# Patient Record
Sex: Female | Born: 2000
Health system: Southern US, Community
[De-identification: ages and names within clinical notes are randomized; demographics above are authoritative.]

---
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
## 2023-12-20 ENCOUNTER — Ambulatory Visit: Payer: Self-pay | Admitting: Family

## 2023-12-20 ENCOUNTER — Other Ambulatory Visit (HOSPITAL_COMMUNITY)
Admission: RE | Admit: 2023-12-20 | Discharge: 2023-12-20 | Disposition: A | Discharge status: Home / Self Care | Source: Ambulatory Visit | Attending: Family | Admitting: Family

## 2023-12-20 ENCOUNTER — Encounter: Payer: Self-pay | Admitting: Family

## 2023-12-20 ENCOUNTER — Ambulatory Visit (INDEPENDENT_AMBULATORY_CARE_PROVIDER_SITE_OTHER): Admitting: Family

## 2023-12-20 VITALS — BP 110/62 | HR 101 | Temp 98.7°F | Resp 16 | Ht 63.5 in | Wt 222.6 lb

## 2023-12-20 DIAGNOSIS — E781 Pure hyperglyceridemia: Secondary | ICD-10-CM | POA: Diagnosis not present

## 2023-12-20 DIAGNOSIS — Z01419 Encounter for gynecological examination (general) (routine) without abnormal findings: Secondary | ICD-10-CM | POA: Insufficient documentation

## 2023-12-20 DIAGNOSIS — M545 Low back pain, unspecified: Secondary | ICD-10-CM | POA: Diagnosis not present

## 2023-12-20 DIAGNOSIS — R635 Abnormal weight gain: Secondary | ICD-10-CM | POA: Diagnosis not present

## 2023-12-20 DIAGNOSIS — Z0000 Encounter for general adult medical examination without abnormal findings: Secondary | ICD-10-CM

## 2023-12-20 DIAGNOSIS — Z720 Tobacco use: Secondary | ICD-10-CM | POA: Insufficient documentation

## 2023-12-20 DIAGNOSIS — Z0001 Encounter for general adult medical examination with abnormal findings: Secondary | ICD-10-CM | POA: Diagnosis not present

## 2023-12-20 DIAGNOSIS — Z23 Encounter for immunization: Secondary | ICD-10-CM

## 2023-12-20 DIAGNOSIS — Z832 Family history of diseases of the blood and blood-forming organs and certain disorders involving the immune mechanism: Secondary | ICD-10-CM | POA: Diagnosis not present

## 2023-12-20 DIAGNOSIS — R0789 Other chest pain: Secondary | ICD-10-CM | POA: Diagnosis not present

## 2023-12-20 LAB — COMPREHENSIVE METABOLIC PANEL WITH GFR
ALT: 28 U/L (ref 0–35)
AST: 23 U/L (ref 0–37)
Albumin: 4.2 g/dL (ref 3.5–5.2)
Alkaline Phosphatase: 97 U/L (ref 39–117)
BUN: 10 mg/dL (ref 6–23)
CO2: 27 meq/L (ref 19–32)
Calcium: 9 mg/dL (ref 8.4–10.5)
Chloride: 102 meq/L (ref 96–112)
Creatinine, Ser: 0.77 mg/dL (ref 0.40–1.20)
GFR: 110.36 mL/min (ref >60.00)
Glucose, Bld: 88 mg/dL (ref 70–99)
Potassium: 4.3 meq/L (ref 3.5–5.1)
Sodium: 138 meq/L (ref 135–145)
Total Bilirubin: 0.5 mg/dL (ref 0.2–1.2)
Total Protein: 6.8 g/dL (ref 6.0–8.3)

## 2023-12-20 LAB — CBC WITH DIFFERENTIAL/PLATELET
Basophils Absolute: 0 K/uL (ref 0.0–0.1)
Basophils Relative: 0.8 % (ref 0.0–3.0)
Eosinophils Absolute: 0.1 K/uL (ref 0.0–0.7)
Eosinophils Relative: 2.3 % (ref 0.0–5.0)
HCT: 40.5 % (ref 36.0–46.0)
Hemoglobin: 13.4 g/dL (ref 12.0–15.0)
Lymphocytes Relative: 29.5 % (ref 12.0–46.0)
Lymphs Abs: 1.3 K/uL (ref 0.7–4.0)
MCHC: 33.2 g/dL (ref 30.0–36.0)
MCV: 83.6 fl (ref 78.0–100.0)
Monocytes Absolute: 0.3 K/uL (ref 0.1–1.0)
Monocytes Relative: 6.8 % (ref 3.0–12.0)
Neutro Abs: 2.7 K/uL (ref 1.4–7.7)
Neutrophils Relative %: 60.6 % (ref 43.0–77.0)
Platelets: 314 K/uL (ref 150.0–400.0)
RBC: 4.84 Mil/uL (ref 3.87–5.11)
RDW: 13.6 % (ref 11.5–15.5)
WBC: 4.4 K/uL (ref 4.0–10.5)

## 2023-12-20 LAB — LIPID PANEL
Cholesterol: 208 mg/dL — ABNORMAL HIGH (ref 0–200)
HDL: 50.5 mg/dL (ref >39.00)
LDL Cholesterol: 125 mg/dL — ABNORMAL HIGH (ref 0–99)
NonHDL: 157.49
Total CHOL/HDL Ratio: 4
Triglycerides: 161 mg/dL — ABNORMAL HIGH (ref 0.0–149.0)
VLDL: 32.2 mg/dL (ref 0.0–40.0)

## 2023-12-20 LAB — TSH: TSH: 1.04 u[IU]/mL (ref 0.35–5.50)

## 2023-12-20 LAB — HEMOGLOBIN A1C: Hgb A1c MFr Bld: 5.7 % (ref 4.6–6.5)

## 2023-12-20 NOTE — Progress Notes (Signed)
 Subjective:     Patient ID: Regina Sherman, female    DOB: [birth year], 23 y.o.   MRN: [id number]  Chief Complaint  Patient presents with   Annual Exam    HPI  Discussed the use of AI scribe software for clinical note transcription with the patient, who gave verbal consent to proceed.  History of Present Illness  Regina Sherman is a 23 year old female who presents for an annual physical exam and Pap smear. She is accompanied by her mother.  She feels nervous about the Pap smear procedure. Her menstrual cycles are irregular, with periods sometimes absent for two months and then lasting only a day or extending for a week. She experiences frequent diarrhea and stomach pain, often needing to use the bathroom shortly after eating. She occasionally experiences chest pain at rest or with activity, sometimes with shortness of breath. She engages in regular physical activity, exercising at least five days a week, and is working on maintaining a healthy diet. She is not currently on any birth control due to side effects such as headaches, nausea, and weight gain. She uses nicotine and is trying to reduce her usage. She consumes alcohol occasionally and denies any drug use. No current cough, cold symptoms, skin concerns, hearing issues, or urinary problems. No recent depression or anxiety, though she feels stressed about her future plans and job search.     Health Maintenance Due  Topic Date Due   HIV Screening  Never done   Hepatitis C Screening  Never done   COVID-19 Vaccine (3 - 2024-25 season) 12/26/2022   Cervical Cancer Screening (Pap smear)  Never done   DTaP/Tdap/Td (6 - Td or Tdap) 12/08/2023   INFLUENZA VACCINE  11/25/2023    History reviewed. No pertinent past medical history.  Past Surgical History:  Procedure Laterality Date   ANTERIOR CRUCIATE LIGAMENT REPAIR Left 2020   ACL reconstruction   KNEE ARTHROSCOPY Left 2021    Family History  Problem Relation Age of Onset   Heart  murmur Father    Lung cancer Maternal Grandmother    Stroke Maternal Grandfather    Heart murmur Maternal Grandfather    Heart disease Paternal Grandmother    Heart disease Paternal Grandfather    Heart murmur Maternal Aunt    Cancer Neg Hx     Social History   Socioeconomic History   Marital status: Single    Spouse name: Not on file   Number of children: Not on file   Years of education: Not on file   Highest education level: Not on file  Occupational History   Not on file  Tobacco Use   Smoking status: Never    Passive exposure: Yes   Smokeless tobacco: Never  Substance and Sexual Activity   Alcohol use: Yes    Comment: occasional   Drug use: Never   Sexual activity: Not Currently  Other Topics Concern   Not on file  Social History Narrative   Not on file   Social Drivers of Health   Financial Resource Strain: Not on file  Food Insecurity: Not on file  Transportation Needs: Not on file  Physical Activity: Not on file  Stress: Not on file  Social Connections: Unknown (09/04/2021)   Received from [facility]   Social Network    Social Network: Not on file  Intimate Partner Violence: Unknown (07/27/2021)   Received from [facility]   HITS    Physically Hurt: Not on file  Insult or Talk Down To: Not on file    Threaten Physical Harm: Not on file    Scream or Curse: Not on file    Outpatient Medications Prior to Visit  Medication Sig Dispense Refill   cetirizine (ZYRTEC) 10 MG tablet Take 10 mg by mouth daily.     fluticasone (FLONASE) 50 MCG/ACT nasal spray Place into both nostrils daily.     No facility-administered medications prior to visit.    No Known Allergies  Review of Systems  Constitutional:  Negative for weight loss.  HENT:  Negative for congestion and hearing loss.   Eyes:  Negative for blurred vision.  Respiratory:  Negative for cough.   Cardiovascular:  Negative for leg swelling.  Gastrointestinal:  Positive for diarrhea. Negative  for constipation.  Genitourinary:  Negative for dysuria and frequency.  Musculoskeletal:  Negative for myalgias.  Skin:  Negative for rash.  Neurological:  Negative for headaches.  Psychiatric/Behavioral:         Denies depression/anxiety       Objective:    Physical Exam Exam conducted with a chaperone present.      BP 110/62 (BP Location: Right Arm, Patient Position: Sitting, Cuff Size: Large)   Pulse (!) 101   Temp 98.7 F (37.1 C) (Oral)   Resp 16   Ht 5' 3.5 (1.613 m)   Wt 222 lb 9.6 oz (101 kg)   SpO2 98%   BMI 38.81 kg/m  Wt Readings from Last 3 Encounters:  12/20/23 222 lb 9.6 oz (101 kg)  12/08/20 209 lb (94.8 kg) (98%, Z= 2.08)*  01/18/20 197 lb (89.4 kg) (98%, Z= 1.96)*   * Growth percentiles are based on CDC (Girls, 2-20 Years) data.   Physical Exam  Constitutional: She is oriented to person, place, and time. She appears well-developed and well-nourished. No distress.  HENT:  Head: Normocephalic and atraumatic.  Right Ear: Tympanic membrane and ear canal normal.  Left Ear: Tympanic membrane and ear canal normal.  Mouth/Throat: Oropharynx is clear and moist.  Eyes: Pupils are equal, round, and reactive to light. No scleral icterus.  Neck: Normal range of motion. No thyromegaly present.  Cardiovascular: Normal rate and regular rhythm.   No murmur heard. Pulmonary/Chest: Effort normal and breath sounds normal. No respiratory distress. He has no wheezes. She has no rales. She exhibits no tenderness.  Abdominal: Soft. Bowel sounds are normal. She exhibits no distension and no mass. There is no tenderness. There is no rebound and no guarding.  Musculoskeletal: She exhibits no edema.  Lymphadenopathy:    She has no cervical adenopathy.  Neurological: She is alert and oriented to person, place, and time. She has normal patellar reflexes. She exhibits normal muscle tone. Coordination normal.  Skin: Skin is warm and dry.  Psychiatric: She has a normal mood and  affect. Her behavior is normal. Judgment and thought content normal.  Breasts: Examined lying Right: Without masses, retractions, discharge or axillary adenopathy.  Left: Without masses, retractions, discharge or axillary adenopathy.  Inguinal/mons: Normal without inguinal adenopathy  External genitalia: Normal  BUS/Urethra/Skene's glands: Normal  Bladder: Normal  Vagina: Normal  Cervix: Normal  Uterus: normal in size, shape and contour. Midline and mobile  Adnexa/parametria:  Rt: Without masses or tenderness.  Lt: Without masses or tenderness.  Anus and perineum: Normal            Assessment & Plan:       Assessment & Plan:   Problem List Items Addressed This  Visit       Unprioritized   Preventative health care - Primary     Routine adult wellness visit. Discussed healthy diet, exercise habits, and self-breast exams. Reviewed dental and eye exam status. - Encourage meal prepping for healthy diet. - Performed Pap smear. - Order cholesterol and glucose tests. - Check thyroid  function. - Men B vaccine today.      Relevant Orders   Comp Met (CMET)   CBC w/Diff   Lipid panel   HgB A1c   Nicotine abuse    - Discussed reducing concentration and eventual cessation.       Midline low back pain without sciatica    Back pain possibly related to weight gain and exercise. - Encourage stretching and weight management.      Atypical chest pain    Intermittent chest pain at rest and with activity, sometimes with shortness of breath. Differential includes non-cardiac causes given age and health status. - EKG tracing is personally reviewed.  EKG notes NSR.  No acute changes.   - Consider cardiology referral if symptoms worsen or EKG is abnormal. She is leaving soon for [place] but could seek care there if needed.      Relevant Orders   EKG 12-Lead (Completed)   Other Visit Diagnoses       Hypertriglyceridemia       Relevant Orders   Comp Met (CMET)   CBC  w/Diff   Lipid panel   HgB A1c     Family history of thrombocytopenia       Relevant Orders   Comp Met (CMET)   CBC w/Diff   Lipid panel   HgB A1c     Weight gain       Relevant Orders   TSH     Encounter for routine gynecological examination with Papanicolaou smear of cervix       Relevant Orders   Cytology - PAP( [place])     Need for meningococcal vaccination       Relevant Orders   Meningococcal B, OMV (Bexsero) (Completed)       I am having Aliha Riso maintain her cetirizine and fluticasone.  No orders of the defined types were placed in this encounter.

## 2023-12-20 NOTE — Assessment & Plan Note (Addendum)
   Routine adult wellness visit. Discussed healthy diet, exercise habits, and self-breast exams. Reviewed dental and eye exam status. - Encourage meal prepping for healthy diet. - Performed Pap smear. - Order cholesterol and glucose tests. - Check thyroid  function. - Men B vaccine today.

## 2023-12-20 NOTE — Patient Instructions (Addendum)
 VISIT SUMMARY:  Today, you came in for your annual physical exam and Pap smear. We discussed several health concerns, including your irregular menstrual cycles, chest pain, chronic diarrhea, and nicotine use. We also reviewed your exercise and diet habits.  YOUR PLAN:  CHEST PAIN: You have been experiencing intermittent chest pain at rest and with activity, sometimes with shortness of breath. - Your EKG is normal which is reassuring. -If your symptoms worsen or the EKG shows any issues, we may refer you to a cardiologist.  IRREGULAR MENSTRUATION: Your periods have been irregular, sometimes absent for two months, and varying in duration. -We will monitor your menstrual cycles and consider further evaluation for conditions like PCOS if needed.  CHRONIC DIARRHEA: You have frequent diarrhea, often after eating, with no specific food triggers. -We will monitor your symptoms and consider the possibility of IBS.  OBESITY: You are working on weight loss through exercise and healthy eating. -Continue with regular exercise and maintaining a healthy diet.  BACK PAIN: You have been experiencing back pain, possibly related to weight gain and exercise. -Continue stretching exercises and focus on weight management.  NICOTINE DEPENDENCE (VAPING): You are currently using sports strength nicotine and trying to reduce your usage. -Work on reducing the concentration of nicotine and aim for eventual cessation.  ADULT WELLNESS VISIT: This was your routine adult wellness visit. -Continue with a healthy diet and regular exercise. -Perform self-breast exams regularly. -We performed a Pap smear today. -We will order cholesterol and glucose tests. -We will check your thyroid  function.

## 2023-12-20 NOTE — Assessment & Plan Note (Addendum)
-   Discussed reducing concentration and eventual cessation.

## 2023-12-20 NOTE — Assessment & Plan Note (Signed)
  Intermittent chest pain at rest and with activity, sometimes with shortness of breath. Differential includes non-cardiac causes given age and health status. - EKG tracing is personally reviewed.  EKG notes NSR.  No acute changes.   - Consider cardiology referral if symptoms worsen or EKG is abnormal. She is leaving soon for [place] but could seek care there if needed.

## 2023-12-20 NOTE — Assessment & Plan Note (Signed)
  Back pain possibly related to weight gain and exercise. - Encourage stretching and weight management.

## 2023-12-21 LAB — CYTOLOGY - PAP: Diagnosis: NEGATIVE

## 2024-01-03 NOTE — Telephone Encounter (Signed)
 LMOM asking for call back. Also informed she can see mychart .
